# Patient Record
Sex: Female | Born: 1995 | Race: White | Hispanic: No | Marital: Married | Smoking: Current every day smoker
Health system: Southern US, Community
[De-identification: ages and names within clinical notes are randomized; demographics above are authoritative.]

## PROBLEM LIST (undated history)

## (undated) DIAGNOSIS — O009 Unspecified ectopic pregnancy without intrauterine pregnancy: Secondary | ICD-10-CM

## (undated) HISTORY — PX: MANDIBLE SURGERY: SHX707

---

## 2016-12-24 ENCOUNTER — Emergency Department (HOSPITAL_COMMUNITY)
Admission: EM | Admit: 2016-12-24 | Discharge: 2016-12-24 | Disposition: A | Discharge status: Home / Self Care | Payer: Self-pay | Attending: Emergency Medicine | Admitting: Emergency Medicine

## 2016-12-24 ENCOUNTER — Emergency Department (HOSPITAL_COMMUNITY): Payer: Self-pay

## 2016-12-24 ENCOUNTER — Encounter (HOSPITAL_COMMUNITY): Payer: Self-pay | Admitting: *Deleted

## 2016-12-24 DIAGNOSIS — F17210 Nicotine dependence, cigarettes, uncomplicated: Secondary | ICD-10-CM | POA: Insufficient documentation

## 2016-12-24 DIAGNOSIS — Z3A01 Less than 8 weeks gestation of pregnancy: Secondary | ICD-10-CM | POA: Insufficient documentation

## 2016-12-24 DIAGNOSIS — O9989 Other specified diseases and conditions complicating pregnancy, childbirth and the puerperium: Secondary | ICD-10-CM | POA: Insufficient documentation

## 2016-12-24 DIAGNOSIS — R102 Pelvic and perineal pain: Secondary | ICD-10-CM

## 2016-12-24 DIAGNOSIS — R1030 Lower abdominal pain, unspecified: Secondary | ICD-10-CM | POA: Insufficient documentation

## 2016-12-24 DIAGNOSIS — Z8759 Personal history of other complications of pregnancy, childbirth and the puerperium: Secondary | ICD-10-CM | POA: Insufficient documentation

## 2016-12-24 HISTORY — DX: Unspecified ectopic pregnancy without intrauterine pregnancy: O00.90

## 2016-12-24 LAB — COMPREHENSIVE METABOLIC PANEL
ALT: 23 U/L (ref 14–54)
AST: 29 U/L (ref 15–41)
Albumin: 3.9 g/dL (ref 3.5–5.0)
Alkaline Phosphatase: 69 U/L (ref 38–126)
Anion gap: 8 (ref 5–15)
BUN: 5 mg/dL — ABNORMAL LOW (ref 6–20)
CO2: 25 mmol/L (ref 22–32)
Calcium: 9.3 mg/dL (ref 8.9–10.3)
Chloride: 103 mmol/L (ref 101–111)
Creatinine, Ser: 0.49 mg/dL (ref 0.44–1.00)
GFR calc Af Amer: >60 mL/min (ref >60)
GFR calc non Af Amer: >60 mL/min (ref >60)
Glucose, Bld: 107 mg/dL — ABNORMAL HIGH (ref 65–99)
Potassium: 3.7 mmol/L (ref 3.5–5.1)
Sodium: 136 mmol/L (ref 135–145)
Total Bilirubin: 0.4 mg/dL (ref 0.3–1.2)
Total Protein: 7.2 g/dL (ref 6.5–8.1)

## 2016-12-24 LAB — URINALYSIS, ROUTINE W REFLEX MICROSCOPIC
Bilirubin Urine: NEGATIVE
Glucose, UA: NEGATIVE mg/dL
Hgb urine dipstick: NEGATIVE
Ketones, ur: NEGATIVE mg/dL
Nitrite: NEGATIVE
Protein, ur: NEGATIVE mg/dL
Specific Gravity, Urine: 1.003 — ABNORMAL LOW (ref 1.005–1.030)
pH: 7 (ref 5.0–8.0)

## 2016-12-24 LAB — CBC
HCT: 38.7 % (ref 36.0–46.0)
Hemoglobin: 13.3 g/dL (ref 12.0–15.0)
MCH: 31.8 pg (ref 26.0–34.0)
MCHC: 34.4 g/dL (ref 30.0–36.0)
MCV: 92.6 fL (ref 78.0–100.0)
Platelets: 327 10*3/uL (ref 150–400)
RBC: 4.18 MIL/uL (ref 3.87–5.11)
RDW: 12.9 % (ref 11.5–15.5)
WBC: 6.6 10*3/uL (ref 4.0–10.5)

## 2016-12-24 LAB — HCG, QUANTITATIVE, PREGNANCY: hCG, Beta Chain, Quant, S: 35156 m[IU]/mL — ABNORMAL HIGH (ref <5)

## 2016-12-24 MED ORDER — ONDANSETRON HCL 4 MG PO TABS: 4.0000 mg | ORAL_TABLET | Freq: Three times a day (TID) | ORAL | 0 refills | Status: AC | PRN

## 2016-12-24 NOTE — ED Triage Notes (Signed)
Pt c/o lower abdominal cramping, nausea, vomiting, dizziness x 2 weeks. Pt took home pregnancy test and was positive 1 week ago. Pt has hx of ectopic pregnancy 1 year ago. Denies vaginal bleeding. Reports Dalayna Lauter vaginal discharge.

## 2016-12-26 LAB — URINE CULTURE: CULTURE: NO GROWTH

## 2017-01-14 NOTE — ED Provider Notes (Signed)
[facility] EMERGENCY DEPARTMENT Provider Note   CSN: [id number] Arrival date & time: 12/24/16  1221     History   Chief Complaint Chief Complaint  Patient presents with  . Abdominal Pain    home preg test +    HPI Emily Patel is a 21 y.o. female.  HPI   21yF with crampy lower abd pain. Onset about a week ago but stronger in last day. No vag bleeding. Positive home pregnancy test. Think LMP 7w ago. Prior ectopic. No dizziness, lightheadedness, dyspnea. No urinary complaints.   Past Medical History:  Diagnosis Date  . Ectopic pregnancy     There are no active problems to display for this patient.   Past Surgical History:  Procedure Laterality Date  . MANDIBLE SURGERY     screw and plate in place    OB History    Gravida Para Term Preterm AB Living   3       1 0   SAB TAB Ectopic Multiple Live Births       1   0       Home Medications    Prior to Admission medications   Medication Sig Start Date End Date Taking? Authorizing Provider  ondansetron (ZOFRAN) 4 MG tablet Take 1 tablet (4 mg total) by mouth every 8 (eight) hours as needed for nausea or vomiting. 12/24/16   Raeford Razor, MD    Family History No family history on file.  Social History Social History  Substance Use Topics  . Smoking status: Current Every Day Smoker    Packs/day: 0.50    Types: Cigarettes  . Smokeless tobacco: Never Used  . Alcohol use No     Allergies   Patient has no known allergies.   Review of Systems Review of Systems  All systems reviewed and negative, other than as noted in HPI.   Physical Exam Updated Vital Signs BP 117/62 (BP Location: Left Arm)   Pulse 66   Temp 98.4 F (36.9 C) (Oral)   Resp 16   Ht  (1.676 m)   Wt 65.8 kg (145 lb)   LMP 11/13/2016   SpO2 100%   BMI 23.40 kg/m   Physical Exam  Constitutional: She appears well-developed and well-nourished. No distress.  HENT:  Head: Normocephalic and atraumatic.  Eyes: Conjunctivae are  normal. Right eye exhibits no discharge. Left eye exhibits no discharge.  Neck: Neck supple.  Cardiovascular: Normal rate, regular rhythm and normal heart sounds.  Exam reveals no gallop and no friction rub.   No murmur heard. Pulmonary/Chest: Effort normal and breath sounds normal. No respiratory distress.  Abdominal: Soft. She exhibits no distension. There is no tenderness.  Very mild suprapubic tenderness w/o rebound or guarding  Musculoskeletal: She exhibits no edema or tenderness.  Neurological: She is alert.  Skin: Skin is warm and dry.  Psychiatric: She has a normal mood and affect. Her behavior is normal. Thought content normal.  Nursing note and vitals reviewed.    ED Treatments / Results  Labs (all labs ordered are listed, but only abnormal results are displayed) Labs Reviewed  COMPREHENSIVE METABOLIC PANEL - Abnormal; Notable for the following:       Result Value   Glucose, Bld 107 (*)    BUN 5 (*)    All other components within normal limits  URINALYSIS, ROUTINE W REFLEX MICROSCOPIC - Abnormal; Notable for the following:    APPearance HAZY (*)    Specific Gravity, Urine 1.003 (*)  Leukocytes, UA MODERATE (*)    Bacteria, UA RARE (*)    Squamous Epithelial / LPF 0-5 (*)    All other components within normal limits  HCG, QUANTITATIVE, PREGNANCY - Abnormal; Notable for the following:    hCG, Beta Chain, Quant, S 35,156 (*)    All other components within normal limits  URINE CULTURE  CBC    EKG  EKG Interpretation None       Radiology No results found.   US Ob Comp Less 14 Wks  Result Date: 12/24/2016 CLINICAL DATA:  Pelvic cramping.  Early pregnancy. EXAM: OBSTETRIC <14 WK [place] AND TRANSVAGINAL OB US TECHNIQUE: Both transabdominal and transvaginal ultrasound examinations were performed for complete evaluation of the gestation as well as the maternal uterus, adnexal regions, and pelvic cul-de-sac. Transvaginal technique was performed to assess early  pregnancy. COMPARISON:  None. FINDINGS: Intrauterine gestational sac: Single Yolk sac:  Present Embryo:  Present Cardiac Activity: Present Heart Rate: 124  bpm MSD:   mm    w     d CRL:  5.7  mm   6 w   3 d                  [place] [facility]: Aug 16, 2017 Subchorionic hemorrhage:  None visualized. Maternal uterus/adnexae: Normal. IMPRESSION: Normal single intrauterine gestation measuring to 6 weeks and 3 days without complications. Electronically Signed   By: Sherian Rein M.D.   On: 12/24/2016 15:30   US Ob Transvaginal  Result Date: 12/24/2016 CLINICAL DATA:  Pelvic cramping.  Early pregnancy. EXAM: OBSTETRIC <14 WK [place] AND TRANSVAGINAL OB US TECHNIQUE: Both transabdominal and transvaginal ultrasound examinations were performed for complete evaluation of the gestation as well as the maternal uterus, adnexal regions, and pelvic cul-de-sac. Transvaginal technique was performed to assess early pregnancy. COMPARISON:  None. FINDINGS: Intrauterine gestational sac: Single Yolk sac:  Present Embryo:  Present Cardiac Activity: Present Heart Rate: 124  bpm MSD:   mm    w     d CRL:  5.7  mm   6 w   3 d                  [place] [facility]: Aug 16, 2017 Subchorionic hemorrhage:  None visualized. Maternal uterus/adnexae: Normal. IMPRESSION: Normal single intrauterine gestation measuring to 6 weeks and 3 days without complications. Electronically Signed   By: Sherian Rein M.D.   On: 12/24/2016 15:30    Procedures Procedures (including critical care time)  Medications Ordered in ED Medications - No data to display   Initial Impression / Assessment and Plan / ED Course  I have reviewed the triage vital signs and the nursing notes.  Pertinent labs & imaging results that were available during my care of the patient were reviewed by me and considered in my medical decision making (see chart for details).     21yF with abdominal cramping in early pregnancy. IUP with FHT. oupt OB FU. Abdominal exam fairly benign. Minimal lower  tenderness. I doubt emergent process.   Final Clinical Impressions(s) / ED Diagnoses   Final diagnoses:  Less than [redacted] weeks gestation of pregnancy    New Prescriptions Discharge Medication List as of 12/24/2016  4:00 PM    START taking these medications   Details  ondansetron (ZOFRAN) 4 MG tablet Take 1 tablet (4 mg total) by mouth every 8 (eight) hours as needed for nausea or vomiting., Starting Tue 12/24/2016, Print  Raeford Razor, MD 01/14/17 1159

## 2017-03-05 ENCOUNTER — Other Ambulatory Visit (HOSPITAL_COMMUNITY)
Admission: RE | Admit: 2017-03-05 | Discharge: 2017-03-05 | Disposition: A | Discharge status: Home / Self Care | Payer: Medicaid Other | Source: Ambulatory Visit | Attending: Obstetrics and Gynecology | Admitting: Obstetrics and Gynecology

## 2017-03-05 ENCOUNTER — Encounter: Payer: Self-pay | Admitting: Obstetrics and Gynecology

## 2017-03-05 ENCOUNTER — Ambulatory Visit (INDEPENDENT_AMBULATORY_CARE_PROVIDER_SITE_OTHER): Payer: Medicaid Other | Admitting: Obstetrics and Gynecology

## 2017-03-05 DIAGNOSIS — B9689 Other specified bacterial agents as the cause of diseases classified elsewhere: Secondary | ICD-10-CM | POA: Diagnosis not present

## 2017-03-05 DIAGNOSIS — Z87898 Personal history of other specified conditions: Secondary | ICD-10-CM

## 2017-03-05 DIAGNOSIS — O0990 Supervision of high risk pregnancy, unspecified, unspecified trimester: Secondary | ICD-10-CM | POA: Insufficient documentation

## 2017-03-05 DIAGNOSIS — Z23 Encounter for immunization: Secondary | ICD-10-CM | POA: Diagnosis not present

## 2017-03-05 DIAGNOSIS — Z720 Tobacco use: Secondary | ICD-10-CM | POA: Insufficient documentation

## 2017-03-05 DIAGNOSIS — A5901 Trichomonal vulvovaginitis: Secondary | ICD-10-CM | POA: Insufficient documentation

## 2017-03-05 DIAGNOSIS — O0992 Supervision of high risk pregnancy, unspecified, second trimester: Secondary | ICD-10-CM | POA: Diagnosis not present

## 2017-03-05 DIAGNOSIS — F1991 Other psychoactive substance use, unspecified, in remission: Secondary | ICD-10-CM | POA: Insufficient documentation

## 2017-03-05 NOTE — Progress Notes (Signed)
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 Last used 1 yr ago

## 2017-03-05 NOTE — Patient Instructions (Signed)

## 2017-03-05 NOTE — Progress Notes (Signed)
Subjective:  Emily Patel is a 21 y.o. G3P0020 at 8573w0d being seen today for her first OB visit. She has no complaints today. < 1/2 ppd smoker. H/O opioid use in the past. No use x 6 months. Was taking cousin's Subutex. H/O ectopic pregnancy in the past, medically treated.  Denies any chronic medical problems or medications o/w. Marland Kitchen. Stopped with pregnancy.   She is currently monitored for the following issues for this high-risk pregnancy and has Supervision of high risk pregnancy, antepartum; History of drug use; and Tobacco abuse on their problem list.  Patient reports no complaints.  Contractions: Not present. Vag. Bleeding: None.  Movement: Absent. Denies leaking of fluid.   The following portions of the patient's history were reviewed and updated as appropriate: allergies, current medications, past family history, past medical history, past social history, past surgical history and problem list. Problem list updated.  Objective:   Vitals:   03/05/17 1502  BP: 129/79  Pulse: (!) 113  Weight: 148 lb (67.1 kg)    Fetal Status: Fetal Heart Rate (bpm): 148   Movement: Absent     General:  Alert, oriented and cooperative. Patient is in no acute distress.  Skin: Skin is warm and dry. No rash noted.   Cardiovascular: Normal heart rate noted  Respiratory: Normal respiratory effort, no problems with respiration noted  Abdomen: Soft, gravid, appropriate for gestational age. Pain/Pressure: Absent     Pelvic:  Cervical exam performed        Extremities: Normal range of motion.  Edema: None  Mental Status: Normal mood and affect. Normal behavior. Normal judgment and thought content.  Breast sym supple no masses nipple discharge or adenopathy Urinalysis:      Assessment and Plan:  Pregnancy: G3P0020 at 7273w0d  1. Supervision of high risk pregnancy, antepartum Prenatal care and labs reviewed with pt.  Flu vaccine today  - Culture, OB Urine - Cystic Fibrosis Mutation 97 - Cytology - PAP -  Hemoglobinopathy evaluation - Obstetric Panel, Including HIV - US MFM OB DETAIL +14 WK; Future - Flu Vaccine QUAD 36+ mos IM (Fluarix, Quad PF) - US MFM OB COMP + 14 WK; Future  2. History of drug use Will refer to Subutex clinic for eval and continued treatment as per their recommendation List of clinics provided to pt  3. Tobacco abuse Advised to stopped. Pregnancy related risks and smoking reviewed.  Preterm labor symptoms and general obstetric precautions including but not limited to vaginal bleeding, contractions, leaking of fluid and fetal movement were reviewed in detail with the patient. Please refer to After Visit Summary for other counseling recommendations.  Return in about 4 weeks (around 04/02/2017) for OB visit.   Hermina StaggersErvin, Michael L, MD

## 2017-03-06 LAB — CYTOLOGY - PAP
BACTERIAL VAGINITIS: POSITIVE — AB
CANDIDA VAGINITIS: NEGATIVE
CHLAMYDIA, DNA PROBE: NEGATIVE
DIAGNOSIS: NEGATIVE
Neisseria Gonorrhea: NEGATIVE
Trichomonas: POSITIVE — AB

## 2017-03-07 LAB — OBSTETRIC PANEL, INCLUDING HIV
Antibody Screen: NEGATIVE
BASOS ABS: 0 10*3/uL (ref 0.0–0.2)
Basos: 0 %
EOS (ABSOLUTE): 0 10*3/uL (ref 0.0–0.4)
Eos: 0 %
HEMATOCRIT: 38.6 % (ref 34.0–46.6)
HEMOGLOBIN: 13 g/dL (ref 11.1–15.9)
HEP B S AG: NEGATIVE
HIV SCREEN 4TH GENERATION: NONREACTIVE
IMMATURE GRANULOCYTES: 0 %
Immature Grans (Abs): 0 10*3/uL (ref 0.0–0.1)
LYMPHS: 20 %
Lymphocytes Absolute: 2 10*3/uL (ref 0.7–3.1)
MCH: 30.7 pg (ref 26.6–33.0)
MCHC: 33.7 g/dL (ref 31.5–35.7)
MCV: 91 fL (ref 79–97)
MONOCYTES: 6 %
Monocytes Absolute: 0.6 10*3/uL (ref 0.1–0.9)
NEUTROS ABS: 7.5 10*3/uL — AB (ref 1.4–7.0)
NEUTROS PCT: 74 %
PLATELETS: 311 10*3/uL (ref 150–379)
RBC: 4.23 x10E6/uL (ref 3.77–5.28)
RDW: 13.3 % (ref 12.3–15.4)
RPR Ser Ql: NONREACTIVE
RUBELLA: 2.47 {index} (ref >0.99)
Rh Factor: POSITIVE
WBC: 10.2 10*3/uL (ref 3.4–10.8)

## 2017-03-07 LAB — HEMOGLOBINOPATHY EVALUATION
HEMOGLOBIN F QUANTITATION: 0 % (ref 0.0–2.0)
HGB A: 97.5 % (ref 96.4–98.8)
HGB C: 0 %
HGB S: 0 %
HGB VARIANT: 0 %
Hemoglobin A2 Quantitation: 2.5 % (ref 1.8–3.2)

## 2017-03-07 LAB — AFP TETRA
DIA Mom Value: 1.38
DIA Value (EIA): 241 pg/mL
DSR (BY AGE) 1 IN: 1130
DSR (Second Trimester) 1 IN: 6107
Gestational Age: 16 WEEKS
MSAFP Mom: 1.05
MSAFP: 33.7 ng/mL
MSHCG MOM: 0.76
MSHCG: 30419 m[IU]/mL
Maternal Age At EDD: 21.9 yr
OSB RISK: 10000
T18 (By Age): 1:4404 {titer}
TEST RESULTS AFP: NEGATIVE
Weight: 148 [lb_av]
uE3 Mom: 1.41
uE3 Value: 1.13 ng/mL

## 2017-03-07 LAB — URINE CULTURE, OB REFLEX

## 2017-03-07 LAB — CULTURE, OB URINE

## 2017-03-13 LAB — CYSTIC FIBROSIS MUTATION 97: Interpretation: NOT DETECTED

## 2017-03-18 ENCOUNTER — Encounter (HOSPITAL_COMMUNITY): Payer: Self-pay | Admitting: Obstetrics and Gynecology

## 2017-03-19 ENCOUNTER — Encounter: Payer: Self-pay | Admitting: Family Medicine

## 2017-03-19 DIAGNOSIS — O99330 Smoking (tobacco) complicating pregnancy, unspecified trimester: Secondary | ICD-10-CM | POA: Insufficient documentation

## 2017-03-19 DIAGNOSIS — O0990 Supervision of high risk pregnancy, unspecified, unspecified trimester: Secondary | ICD-10-CM

## 2017-03-19 DIAGNOSIS — A5901 Trichomonal vulvovaginitis: Secondary | ICD-10-CM | POA: Insufficient documentation

## 2017-03-19 DIAGNOSIS — O23599 Infection of other part of genital tract in pregnancy, unspecified trimester: Secondary | ICD-10-CM

## 2017-03-27 ENCOUNTER — Telehealth: Payer: Self-pay | Admitting: Pediatrics

## 2017-03-27 ENCOUNTER — Ambulatory Visit (HOSPITAL_COMMUNITY)
Admission: RE | Admit: 2017-03-27 | Discharge: 2017-03-27 | Disposition: A | Discharge status: Home / Self Care | Payer: Medicaid Other | Source: Ambulatory Visit | Attending: Obstetrics and Gynecology | Admitting: Obstetrics and Gynecology

## 2017-03-27 ENCOUNTER — Other Ambulatory Visit: Payer: Self-pay | Admitting: Obstetrics and Gynecology

## 2017-03-27 DIAGNOSIS — F1911 Other psychoactive substance abuse, in remission: Secondary | ICD-10-CM

## 2017-03-27 DIAGNOSIS — O0992 Supervision of high risk pregnancy, unspecified, second trimester: Secondary | ICD-10-CM | POA: Diagnosis not present

## 2017-03-27 DIAGNOSIS — Z3A19 19 weeks gestation of pregnancy: Secondary | ICD-10-CM

## 2017-03-27 DIAGNOSIS — O99332 Smoking (tobacco) complicating pregnancy, second trimester: Secondary | ICD-10-CM

## 2017-03-27 DIAGNOSIS — O0990 Supervision of high risk pregnancy, unspecified, unspecified trimester: Secondary | ICD-10-CM

## 2017-03-27 DIAGNOSIS — Z363 Encounter for antenatal screening for malformations: Secondary | ICD-10-CM | POA: Diagnosis not present

## 2017-03-27 DIAGNOSIS — N1330 Unspecified hydronephrosis: Secondary | ICD-10-CM | POA: Diagnosis not present

## 2017-03-27 DIAGNOSIS — Z87898 Personal history of other specified conditions: Secondary | ICD-10-CM | POA: Insufficient documentation

## 2017-03-27 NOTE — Telephone Encounter (Signed)
Per msg left on nursline.  03/27/17 claim for US is still pending.   REF# [id number]  Requesting further information.

## 2017-03-27 NOTE — Telephone Encounter (Signed)
-----   Message from Hermina StaggersMichael L Ervin, MD sent at 03/27/2017 12:18 PM EST ----- Please schedule for follow up U/S in 4 weeks to complete anatomy Thanks Casimiro NeedleMichael

## 2017-04-02 ENCOUNTER — Encounter: Payer: Medicaid Other | Admitting: Obstetrics and Gynecology

## 2017-04-08 ENCOUNTER — Encounter: Payer: Medicaid Other | Admitting: Obstetrics and Gynecology

## 2017-04-09 ENCOUNTER — Encounter: Payer: Self-pay | Admitting: Obstetrics and Gynecology

## 2017-04-09 ENCOUNTER — Ambulatory Visit (INDEPENDENT_AMBULATORY_CARE_PROVIDER_SITE_OTHER): Payer: Medicaid Other | Admitting: Obstetrics and Gynecology

## 2017-04-09 VITALS — Wt 155.0 lb

## 2017-04-09 DIAGNOSIS — O0992 Supervision of high risk pregnancy, unspecified, second trimester: Secondary | ICD-10-CM

## 2017-04-09 DIAGNOSIS — O23592 Infection of other part of genital tract in pregnancy, second trimester: Secondary | ICD-10-CM

## 2017-04-09 DIAGNOSIS — A5901 Trichomonal vulvovaginitis: Secondary | ICD-10-CM | POA: Diagnosis not present

## 2017-04-09 DIAGNOSIS — O0990 Supervision of high risk pregnancy, unspecified, unspecified trimester: Secondary | ICD-10-CM

## 2017-04-09 DIAGNOSIS — O23599 Infection of other part of genital tract in pregnancy, unspecified trimester: Principal | ICD-10-CM

## 2017-04-09 DIAGNOSIS — Z87898 Personal history of other specified conditions: Secondary | ICD-10-CM

## 2017-04-09 DIAGNOSIS — F1991 Other psychoactive substance use, unspecified, in remission: Secondary | ICD-10-CM

## 2017-04-09 MED ORDER — METRONIDAZOLE 500 MG PO TABS: ORAL_TABLET | ORAL | 0 refills | Status: AC

## 2017-04-09 NOTE — Progress Notes (Signed)
Subjective:  Emily Patel is a 22 y.o. G3P0020 at 7236w0d being seen today for ongoing prenatal care.  She is currently monitored for the following issues for this high-risk pregnancy and has Supervision of high risk pregnancy, antepartum; History of drug use; Tobacco abuse; Tobacco use affecting pregnancy, antepartum; and Trichomonal vaginitis during pregnancy, antepartum on their problem list.  Patient reports no complaints.  Contractions: Not present. Vag. Bleeding: None.  Movement: Present. Denies leaking of fluid.   The following portions of the patient's history were reviewed and updated as appropriate: allergies, current medications, past family history, past medical history, past social history, past surgical history and problem list. Problem list updated.  Objective:   Vitals:   04/09/17 1619  Weight: 155 lb (70.3 kg)    Fetal Status:     Movement: Present     General:  Alert, oriented and cooperative. Patient is in no acute distress.  Skin: Skin is warm and dry. No rash noted.   Cardiovascular: Normal heart rate noted  Respiratory: Normal respiratory effort, no problems with respiration noted  Abdomen: Soft, gravid, appropriate for gestational age. Pain/Pressure: Absent     Pelvic:  Cervical exam deferred        Extremities: Normal range of motion.  Edema: None  Mental Status: Normal mood and affect. Normal behavior. Normal judgment and thought content.   Urinalysis:      Assessment and Plan:  Pregnancy: G3P0020 at 7836w0d  1. Supervision of high risk pregnancy, antepartum Stable F/U U/S to complete antanomy  2. Trichomonal vaginitis during pregnancy, antepartum Did not pick up Rx. Sent to new pharmacy - [facility] (FLAGYL) 500 MG tablet; Take two tablets by mouth twice a day, for one day.  Or you can take all four tablets at once if you can tolerate it.  Dispense: 4 tablet; Refill: 0  3. History of drug use Check UDS Had problems getting into Subutex clinic. Will have  SW assist in referral  Preterm labor symptoms and general obstetric precautions including but not limited to vaginal bleeding, contractions, leaking of fluid and fetal movement were reviewed in detail with the patient. Please refer to After Visit Summary for other counseling recommendations.  Return in about 4 weeks (around 05/07/2017) for OB visit.   Hermina StaggersErvin, Raul Winterhalter L, MD

## 2017-04-09 NOTE — Patient Instructions (Signed)

## 2017-04-15 ENCOUNTER — Encounter: Payer: Self-pay | Admitting: *Deleted

## 2017-04-16 LAB — TOXASSURE SELECT 13 (MW), URINE

## 2017-04-28 ENCOUNTER — Other Ambulatory Visit: Payer: Self-pay | Admitting: Obstetrics and Gynecology

## 2017-04-28 ENCOUNTER — Ambulatory Visit (HOSPITAL_COMMUNITY)
Admission: RE | Admit: 2017-04-28 | Discharge: 2017-04-28 | Disposition: A | Discharge status: Home / Self Care | Payer: Medicaid Other | Source: Ambulatory Visit | Attending: Obstetrics and Gynecology | Admitting: Obstetrics and Gynecology

## 2017-04-28 DIAGNOSIS — O0990 Supervision of high risk pregnancy, unspecified, unspecified trimester: Secondary | ICD-10-CM

## 2017-04-28 DIAGNOSIS — O0992 Supervision of high risk pregnancy, unspecified, second trimester: Secondary | ICD-10-CM | POA: Diagnosis not present

## 2017-04-28 DIAGNOSIS — Z362 Encounter for other antenatal screening follow-up: Secondary | ICD-10-CM | POA: Diagnosis not present

## 2017-04-28 DIAGNOSIS — Z3A23 23 weeks gestation of pregnancy: Secondary | ICD-10-CM | POA: Insufficient documentation

## 2017-04-28 DIAGNOSIS — O99332 Smoking (tobacco) complicating pregnancy, second trimester: Secondary | ICD-10-CM

## 2017-04-28 DIAGNOSIS — O99322 Drug use complicating pregnancy, second trimester: Secondary | ICD-10-CM | POA: Diagnosis not present

## 2017-05-07 ENCOUNTER — Encounter: Payer: Medicaid Other | Admitting: Obstetrics & Gynecology

## 2017-05-08 ENCOUNTER — Telehealth: Payer: Self-pay | Admitting: *Deleted

## 2017-05-08 NOTE — Telephone Encounter (Signed)
Erroneous encounter

## 2017-11-12 ENCOUNTER — Encounter (HOSPITAL_COMMUNITY): Payer: Self-pay

## 2018-07-21 IMAGING — US US OB COMP LESS 14 WK
1 series · 14 of 28 positions shown · non-contrast
Comparison: None.

CLINICAL DATA: Pelvic cramping.  Early pregnancy.

EXAM:
OBSTETRIC <14 WK US AND TRANSVAGINAL OB US
TECHNIQUE: Both transabdominal and transvaginal ultrasound examinations were
performed for complete evaluation of the gestation as well as the
maternal uterus, adnexal regions, and pelvic cul-de-sac.
Transvaginal technique was performed to assess early pregnancy.

[Series 1: us ob comp less 14 wk · 0.23mm/px · 80 acquisitions, 14 frames shown]
[im 3/80]
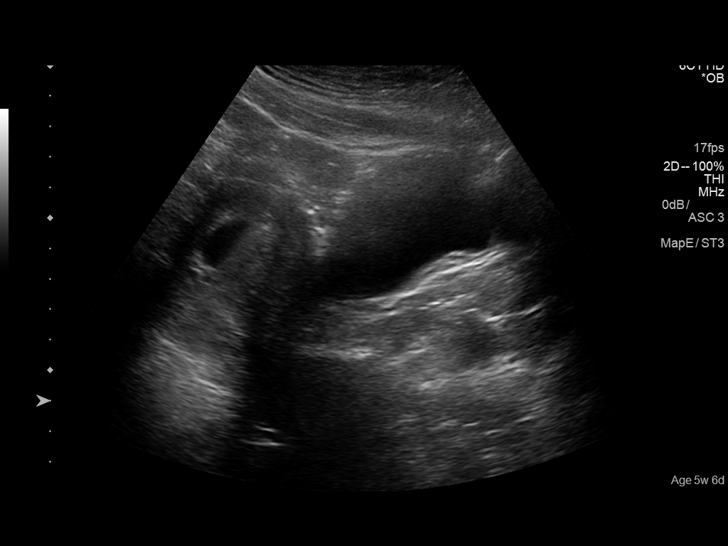
[im 9/80]
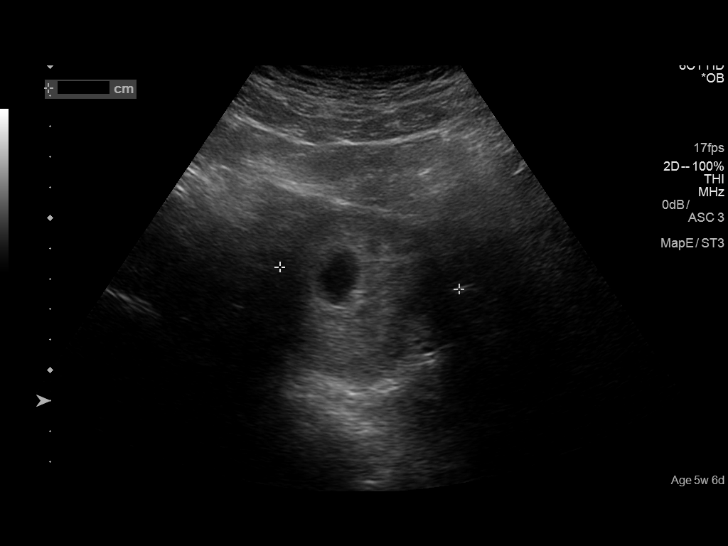
[im 15/80]
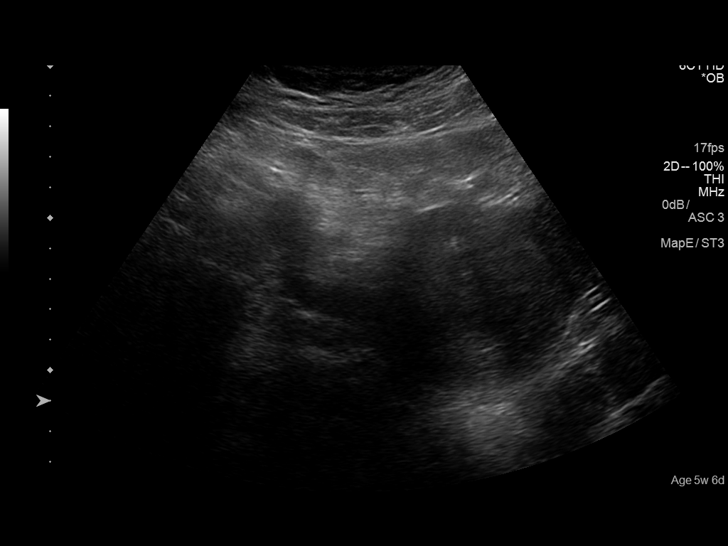
[im 21/80]
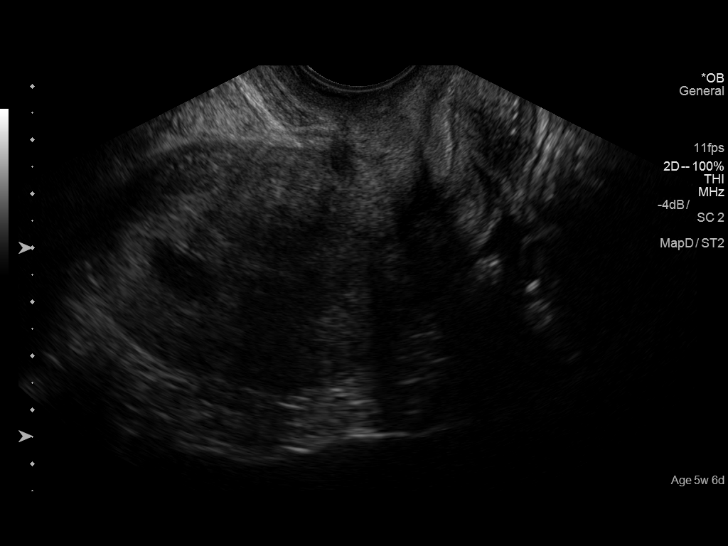
[im 27/80]
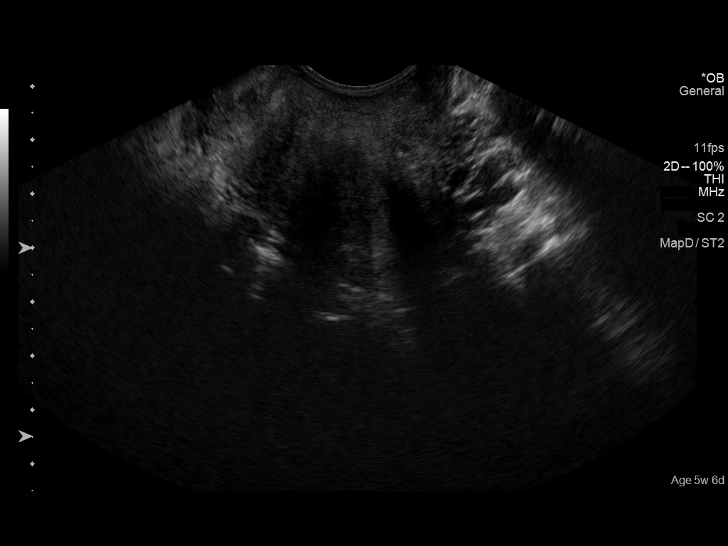
[im 33/80]
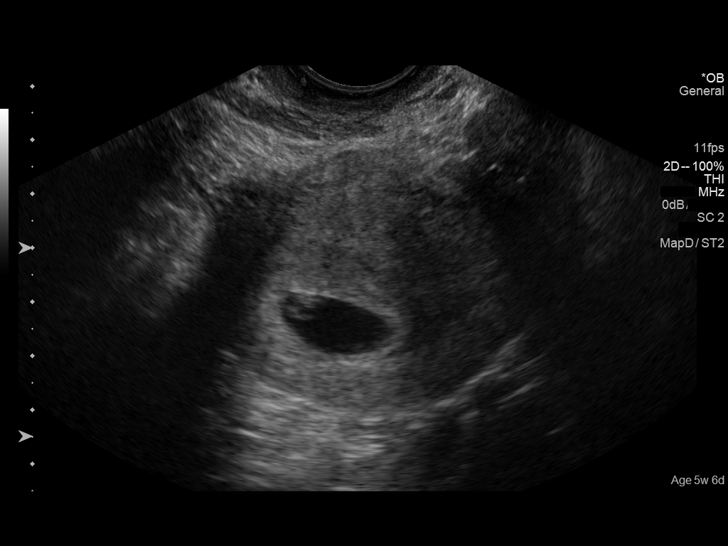
[im 39/80]
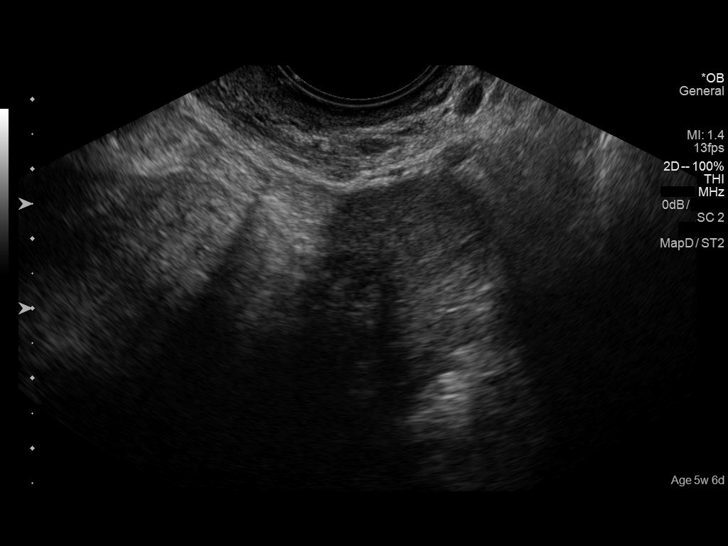
[im 44/80]
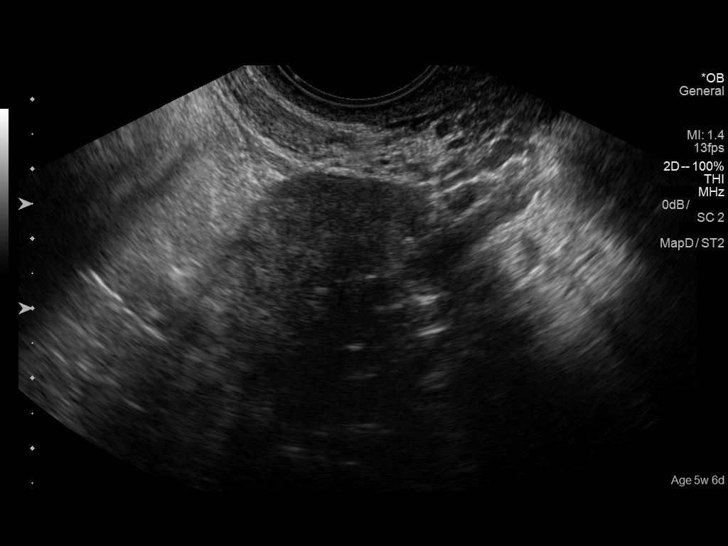
[im 50/80]
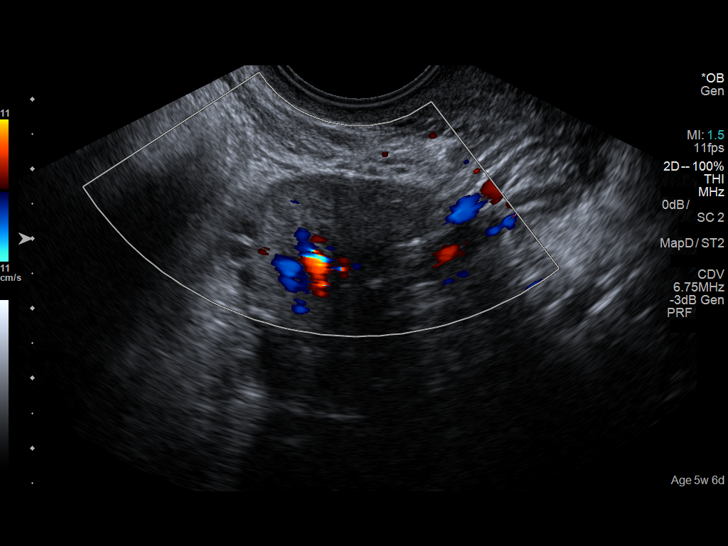
[im 56/80]
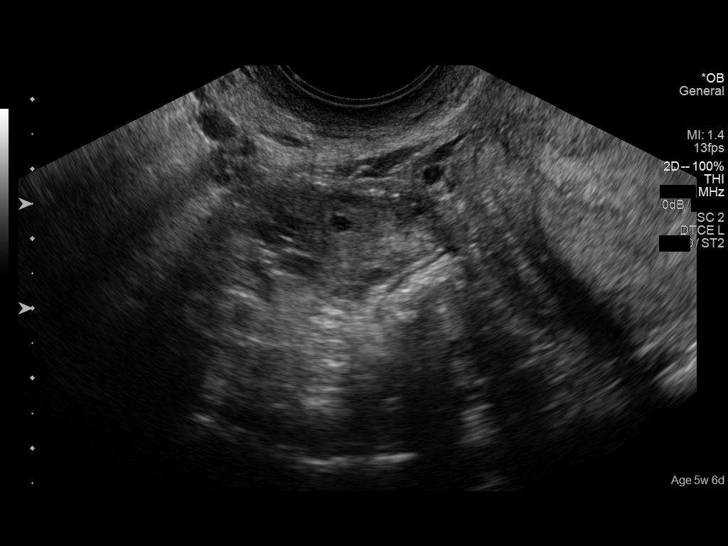
[im 62/80]
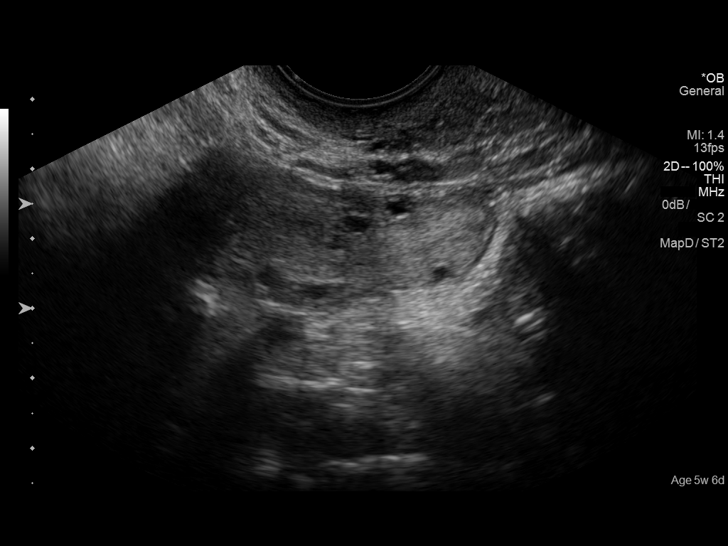
[im 68/80]
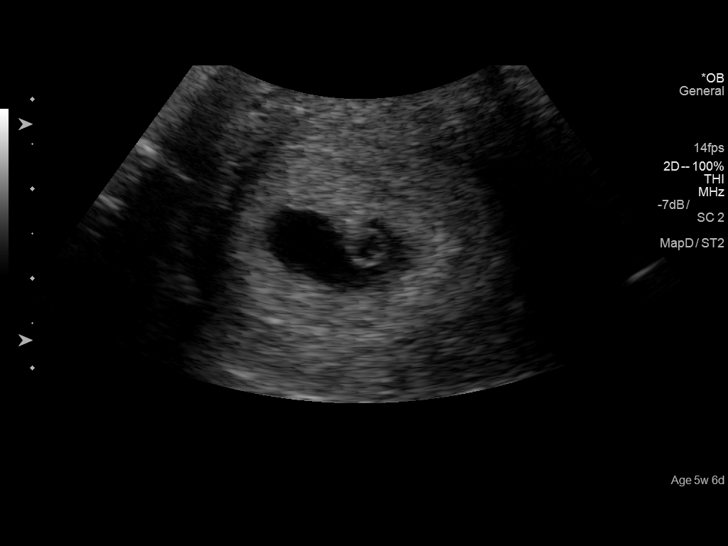
[im 74/80]
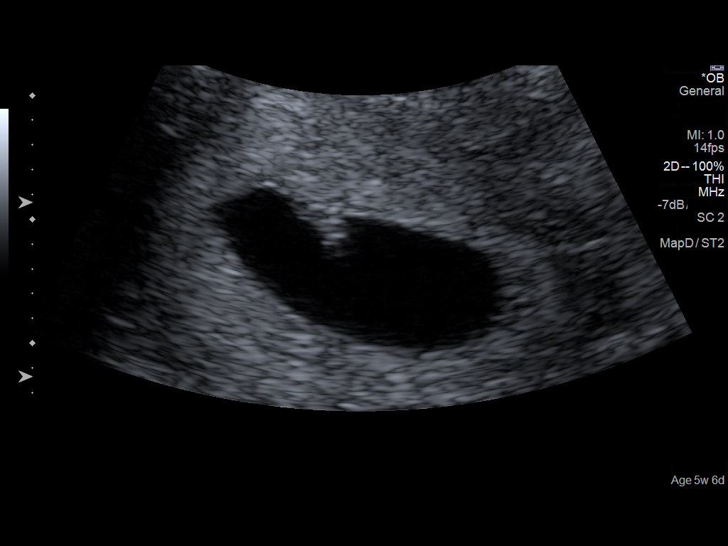
[im 80/80]
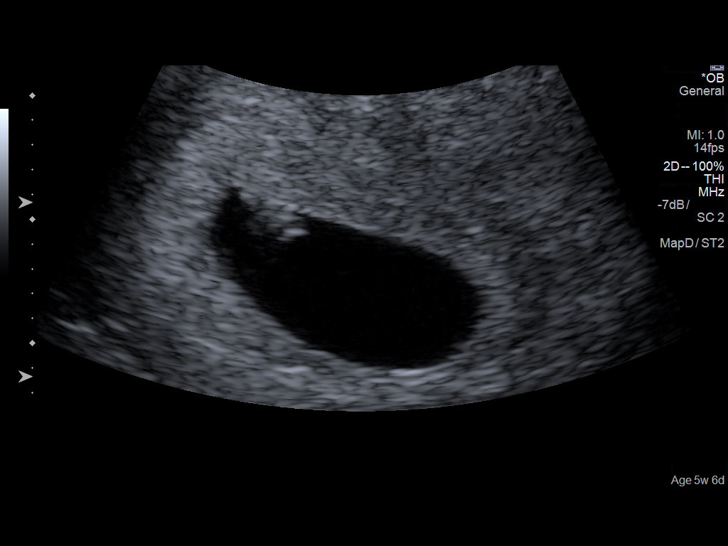

[14 of 28 positions shown; findings below may reference images not displayed]

FINDINGS: Intrauterine gestational sac: Single

Yolk sac:  Present

Embryo:  Present

Cardiac Activity: Present

Heart Rate: 124  bpm

MSD:   mm    w     d

CRL:  5.7  mm   6 w   3 d                  US EDC: August 16, 2017

Subchorionic hemorrhage:  None visualized.

Maternal uterus/adnexae: Normal.
IMPRESSION: Normal single intrauterine gestation measuring to 6 weeks and 3 days
without complications.

## 2018-12-25 IMAGING — US US MFM OB DETAIL+14 WK
1 series · 14 of 28 positions shown · non-contrast
Comparison: none

[Series 1: us mfm ob detail+14 wk · 66 acquisitions, 14 frames shown]
[im 3/66]
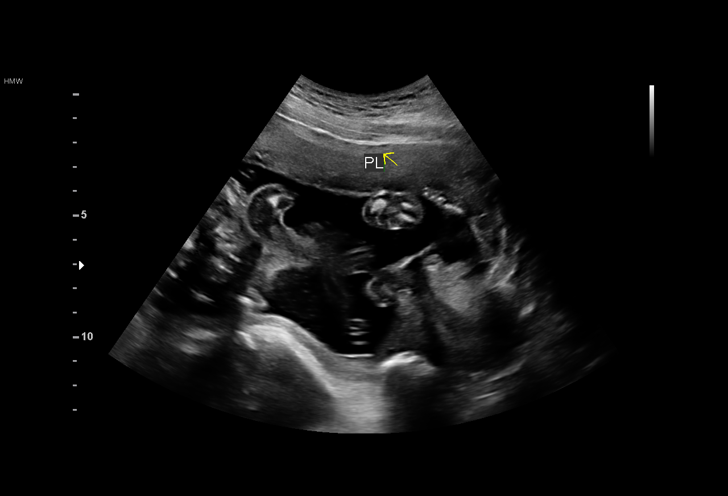
[im 8/66]
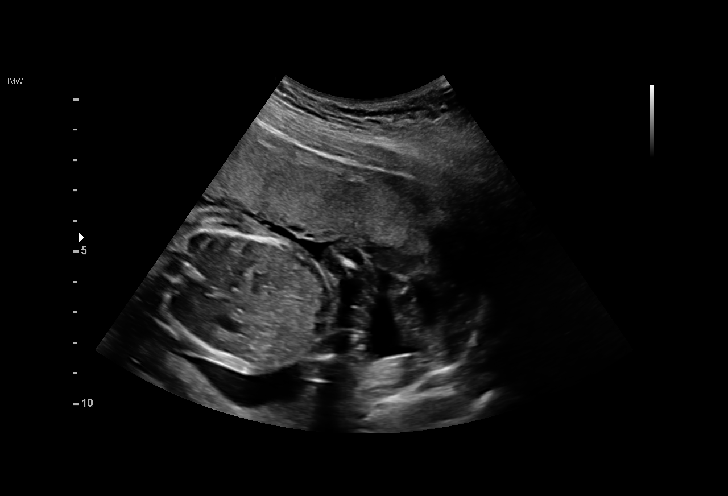
[im 13/66]
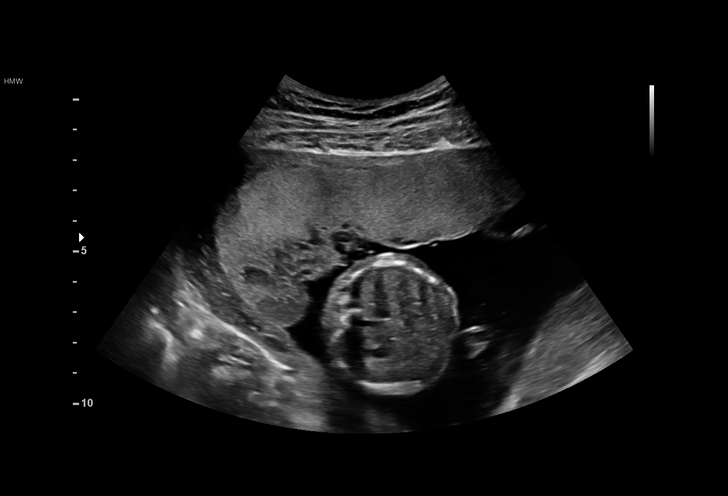
[im 17/66]
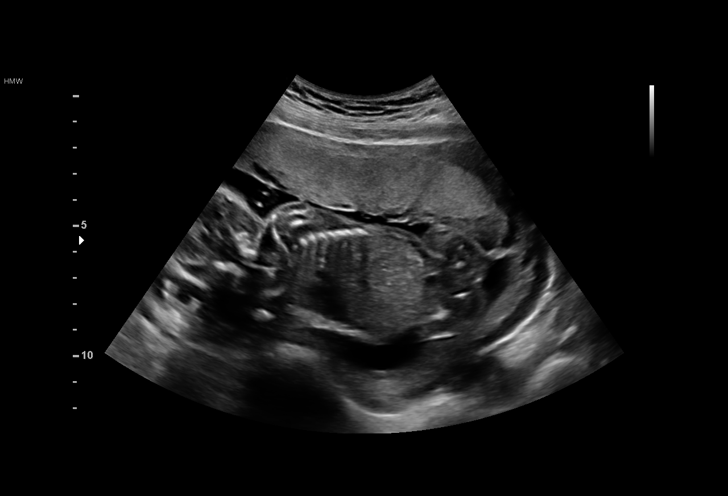
[im 22/66]
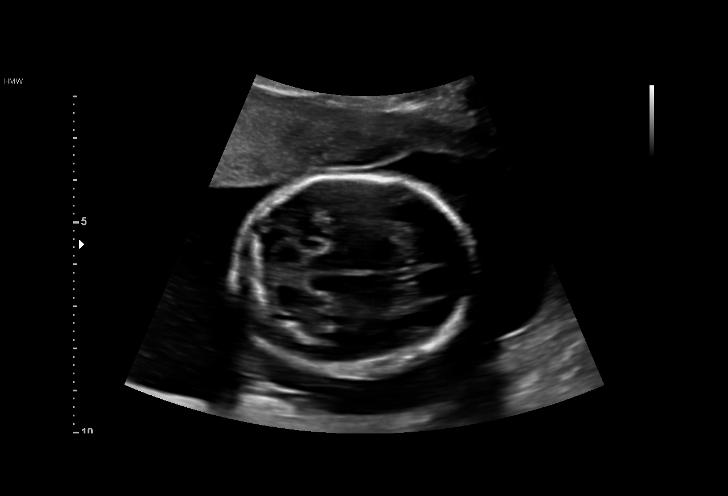
[im 27/66]
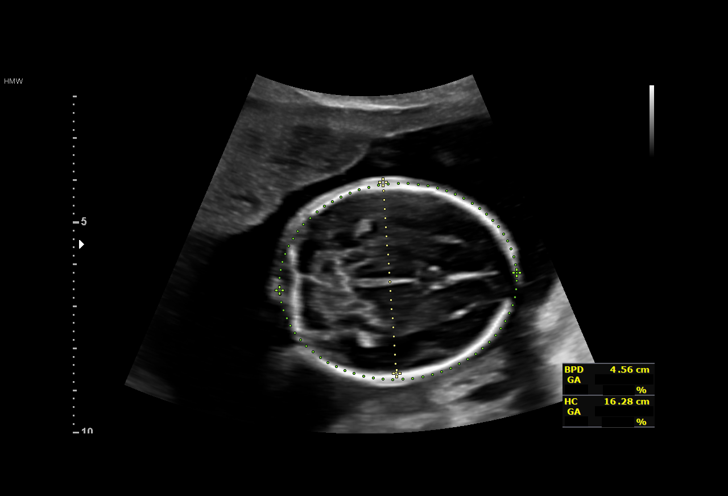
[im 32/66]
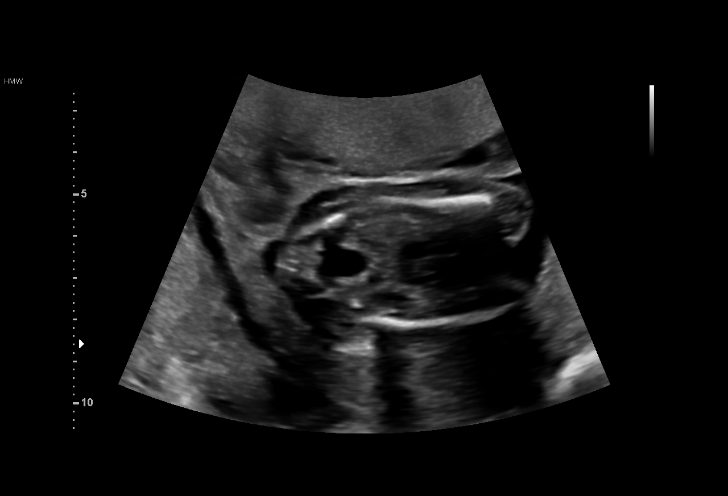
[im 37/66]
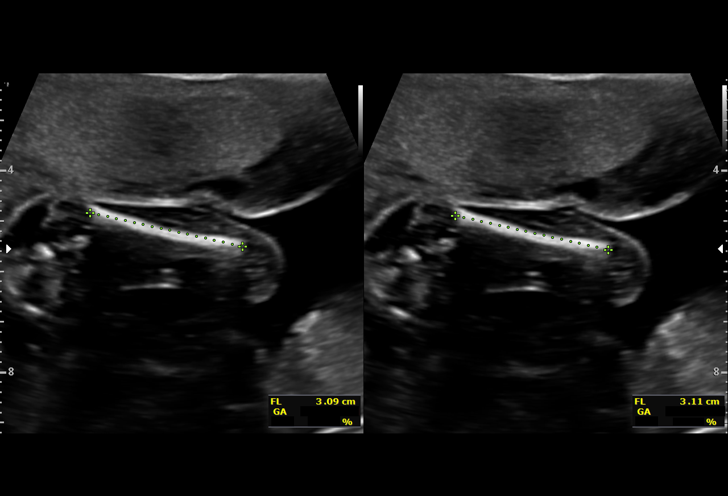
[im 41/66]
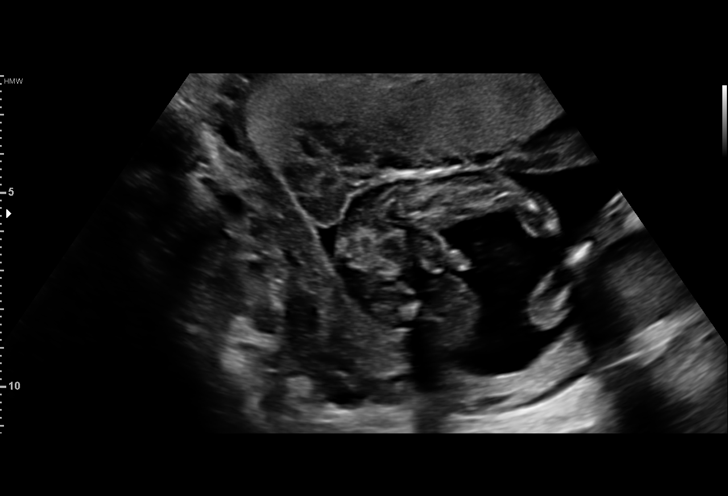
[im 46/66]
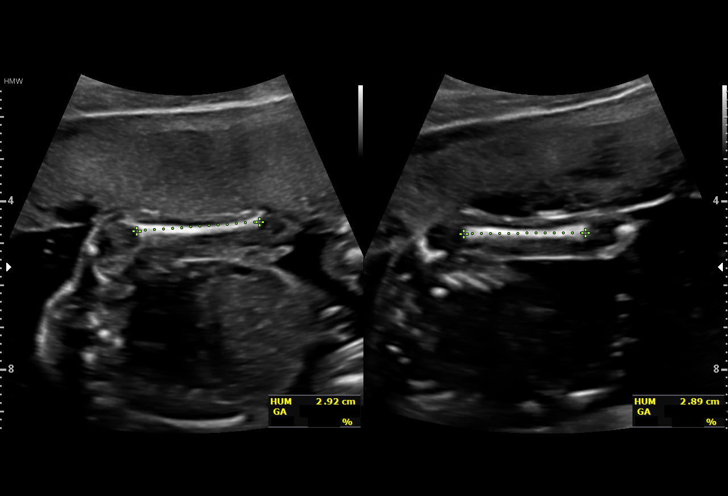
[im 51/66]
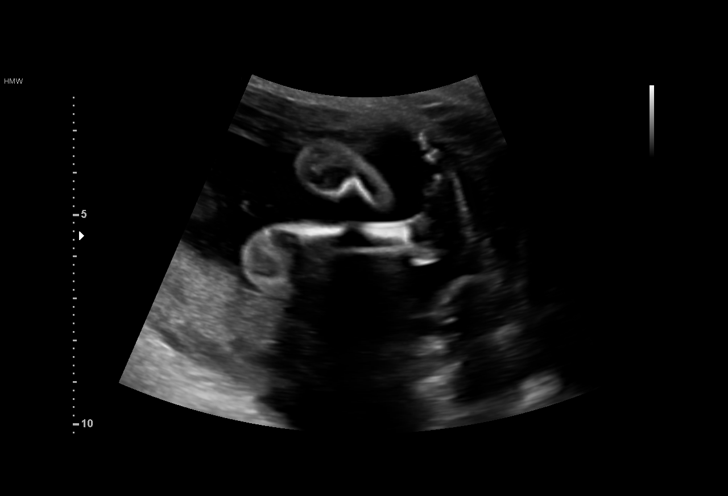
[im 56/66]
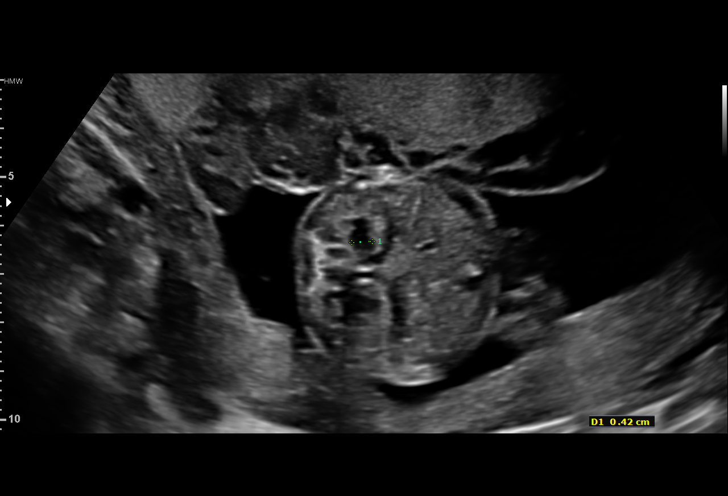
[im 61/66]
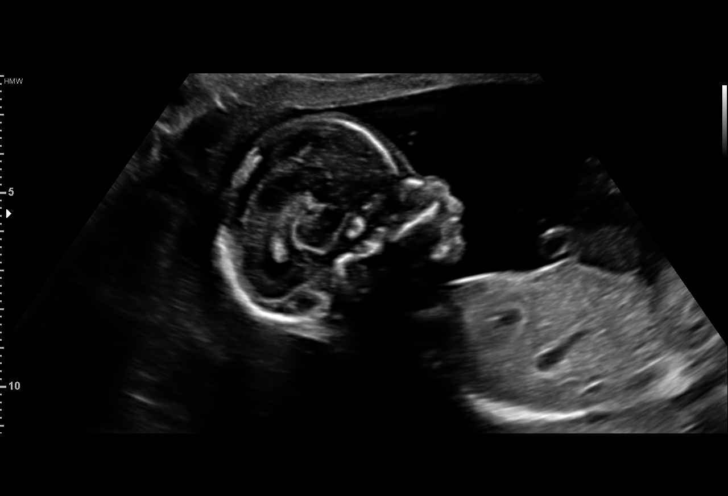
[im 66/66]
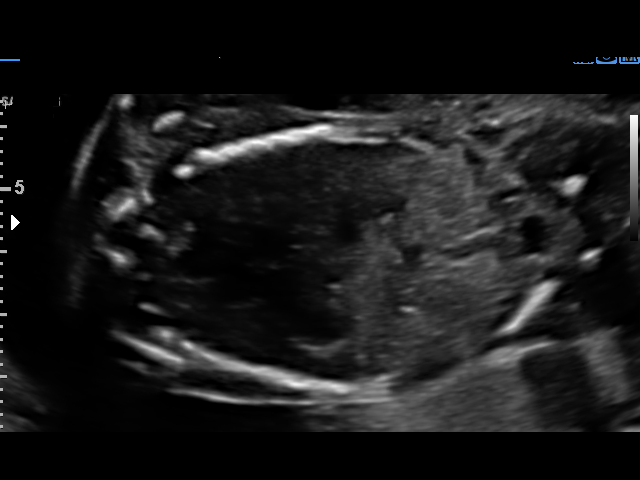

[14 of 28 positions shown; findings below may reference images not displayed]

OB/Gyn Clinic

1  YLBER SH MOJALLIA            [id number]      [id number]     [id number]
Indications

19 weeks gestation of pregnancy
Smoking complicating pregnancy, second
trimester
Encounter for antenatal screening for
malformations
OB History

Gravidity:    3         Term:   0        Prem:   0        SAB:   1
TOP:          0       Ectopic:  1        Living: 0
Fetal Evaluation

Num Of Fetuses:     1
Fetal Heart         140
Rate(bpm):
Cardiac Activity:   Observed
Presentation:       Breech
Placenta:           Anterior, above cervical os
P. Cord Insertion:  Visualized, central

Amniotic Fluid
AFI FV:      Subjectively within normal limits
Biometry

BPD:      45.4  mm     G. Age:  19w 5d         75  %    CI:        78.39   %    70 - 86
FL/HC:      19.1   %    16.1 -
HC:      162.2  mm     G. Age:  19w 0d         36  %    HC/AC:      1.10        1.09 -
AC:      147.1  mm     G. Age:  20w 0d         73  %    FL/BPD:     68.3   %
FL:         31  mm     G. Age:  19w 4d         60  %    FL/AC:      21.1   %    20 - 24
HUM:      29.1  mm     G. Age:  19w 3d         59  %

Est. FW:     310  gm    0 lb 11 oz      55  %
Gestational Age

LMP:           19w 1d        Date:  11/13/16                 EDD:   08/20/17
U/S Today:     19w 4d                                        EDD:   08/17/17
Best:          19w 1d     Det. By:  LMP  (11/13/16)          EDD:   08/20/17
Anatomy

Cranium:               Appears normal         Aortic Arch:            Not well visualized
Cavum:                 Appears normal         Ductal Arch:            Not well visualized
Ventricles:            Appears normal         Diaphragm:              Appears normal
Choroid Plexus:        Appears normal         Stomach:                Appears normal, left
sided
Cerebellum:            Appears normal         Abdomen:                Appears normal
Posterior Fossa:       Appears normal         Abdominal Wall:         Appears nml (cord
insert, abd wall)
Nuchal Fold:           Not well visualized    Cord Vessels:           Appears normal (3
vessel cord)
Face:                  Appears normal         Kidneys:                Bilat pyelectasis,
(orbits and profile)
Rt 4mm, Lt 6mm
Lips:                  Appears normal         Bladder:                Appears normal
Thoracic:              Appears normal         Spine:                  Not well visualized
Heart:                 Not well visualized    Upper Extremities:      Appears normal
RVOT:                  Not well visualized    Lower Extremities:      Appears normal
LVOT:                  Not well visualized

Other:  Fetus appears to be a male. Heels visualized. RT 5th digit visualized.
Nasal bone visualized. Technically difficult due to fetal position.
Cervix Uterus Adnexa

Cervix
Length:            3.3  cm.
Normal appearance by transabdominal scan.

Uterus
No abnormality visualized.

Left Ovary
No adnexal mass visualized.

Right Ovary
No adnexal mass visualized.

Cul De Sac:   No free fluid seen.

Adnexa:       No abnormality visualized.
Impression

Singleton intrauterine pregnancy at 19+1 weeks
Review of the anatomy shows left sided UTD at 6mm (UTD
A1), otherwise no sonographic markers for aneuploidy or
structural anomalies
However, cardiac evaluations should be considered
suboptimal secondary to fetal position
Amniotic fluid volume is normal
Estimated fetal weight is 310g which is growth in the 55th
percentile
Recommendations

Recommend repeat scan in 4 weeks to reassess fetal heart
and urinary tract
